# Patient Record
Sex: Female | Born: 1980 | Race: Black or African American | Hispanic: No | Marital: Married | Smoking: Never smoker
Health system: Southern US, Community
[De-identification: ages and names within clinical notes are randomized; demographics above are authoritative.]

## PROBLEM LIST (undated history)

## (undated) DIAGNOSIS — G5603 Carpal tunnel syndrome, bilateral upper limbs: Secondary | ICD-10-CM

## (undated) DIAGNOSIS — G932 Benign intracranial hypertension: Secondary | ICD-10-CM

## (undated) HISTORY — PX: ADENOIDECTOMY: SUR15

## (undated) HISTORY — PX: ABDOMINAL HYSTERECTOMY: SHX81

---

## 2000-03-01 ENCOUNTER — Other Ambulatory Visit: Admission: RE | Admit: 2000-03-01 | Discharge: 2000-03-01 | Payer: Self-pay | Admitting: Family Medicine

## 2000-10-28 ENCOUNTER — Other Ambulatory Visit: Admission: RE | Admit: 2000-10-28 | Discharge: 2000-10-28 | Payer: Self-pay | Admitting: Obstetrics and Gynecology

## 2000-11-11 ENCOUNTER — Ambulatory Visit (HOSPITAL_COMMUNITY): Admission: RE | Admit: 2000-11-11 | Discharge: 2000-11-11 | Payer: Self-pay | Admitting: Obstetrics and Gynecology

## 2000-11-11 ENCOUNTER — Encounter: Payer: Self-pay | Admitting: Obstetrics and Gynecology

## 2000-12-03 ENCOUNTER — Inpatient Hospital Stay (HOSPITAL_COMMUNITY): Admission: AD | Admit: 2000-12-03 | Discharge: 2000-12-03 | Payer: Self-pay | Admitting: Obstetrics and Gynecology

## 2001-02-11 ENCOUNTER — Encounter: Payer: Self-pay | Admitting: Obstetrics and Gynecology

## 2001-02-11 ENCOUNTER — Ambulatory Visit (HOSPITAL_COMMUNITY): Admission: RE | Admit: 2001-02-11 | Discharge: 2001-02-11 | Payer: Self-pay | Admitting: Obstetrics and Gynecology

## 2001-03-11 ENCOUNTER — Inpatient Hospital Stay (HOSPITAL_COMMUNITY): Admission: AD | Admit: 2001-03-11 | Discharge: 2001-03-14 | Payer: Self-pay | Admitting: *Deleted

## 2001-03-31 ENCOUNTER — Emergency Department (HOSPITAL_COMMUNITY): Admission: EM | Admit: 2001-03-31 | Discharge: 2001-04-01 | Payer: Self-pay | Admitting: Emergency Medicine

## 2001-04-01 ENCOUNTER — Ambulatory Visit (HOSPITAL_COMMUNITY): Admission: RE | Admit: 2001-04-01 | Discharge: 2001-04-01 | Payer: Self-pay | Admitting: Obstetrics and Gynecology

## 2001-04-05 ENCOUNTER — Encounter: Admission: RE | Admit: 2001-04-05 | Discharge: 2001-04-05 | Payer: Self-pay | Admitting: Internal Medicine

## 2001-11-29 ENCOUNTER — Other Ambulatory Visit: Admission: RE | Admit: 2001-11-29 | Discharge: 2001-11-29 | Payer: Self-pay | Admitting: Obstetrics and Gynecology

## 2002-02-07 ENCOUNTER — Emergency Department (HOSPITAL_COMMUNITY): Admission: EM | Admit: 2002-02-07 | Discharge: 2002-02-07 | Payer: Self-pay | Admitting: Physical Therapy

## 2002-12-13 ENCOUNTER — Other Ambulatory Visit: Admission: RE | Admit: 2002-12-13 | Discharge: 2002-12-13 | Payer: Self-pay | Admitting: Obstetrics and Gynecology

## 2005-03-28 ENCOUNTER — Emergency Department (HOSPITAL_COMMUNITY): Admission: EM | Admit: 2005-03-28 | Discharge: 2005-03-28 | Payer: Self-pay | Admitting: Emergency Medicine

## 2005-05-11 ENCOUNTER — Emergency Department (HOSPITAL_COMMUNITY): Admission: EM | Admit: 2005-05-11 | Discharge: 2005-05-11 | Payer: Self-pay | Admitting: Emergency Medicine

## 2005-07-31 ENCOUNTER — Ambulatory Visit (HOSPITAL_COMMUNITY): Admission: RE | Admit: 2005-07-31 | Discharge: 2005-07-31 | Payer: Self-pay | Admitting: Neurology

## 2005-09-18 ENCOUNTER — Ambulatory Visit (HOSPITAL_COMMUNITY): Admission: RE | Admit: 2005-09-18 | Discharge: 2005-09-18 | Payer: Self-pay | Admitting: Neurology

## 2006-02-25 ENCOUNTER — Emergency Department (HOSPITAL_COMMUNITY): Admission: EM | Admit: 2006-02-25 | Discharge: 2006-02-25 | Payer: Self-pay | Admitting: Family Medicine

## 2010-11-24 ENCOUNTER — Other Ambulatory Visit: Payer: Self-pay | Admitting: Family Medicine

## 2010-11-24 ENCOUNTER — Other Ambulatory Visit (HOSPITAL_COMMUNITY)
Admission: RE | Admit: 2010-11-24 | Discharge: 2010-11-24 | Disposition: A | Discharge status: Home / Self Care | Payer: Self-pay | Source: Ambulatory Visit | Attending: Family Medicine | Admitting: Family Medicine

## 2010-11-24 DIAGNOSIS — Z124 Encounter for screening for malignant neoplasm of cervix: Secondary | ICD-10-CM | POA: Insufficient documentation

## 2013-04-24 ENCOUNTER — Ambulatory Visit
Admission: RE | Admit: 2013-04-24 | Discharge: 2013-04-24 | Disposition: A | Discharge status: Home / Self Care | Payer: BC Managed Care – PPO | Source: Ambulatory Visit | Attending: Family Medicine | Admitting: Family Medicine

## 2013-04-24 ENCOUNTER — Other Ambulatory Visit: Payer: Self-pay | Admitting: Family Medicine

## 2013-04-24 DIAGNOSIS — M545 Low back pain, unspecified: Secondary | ICD-10-CM

## 2013-04-24 DIAGNOSIS — M25552 Pain in left hip: Secondary | ICD-10-CM

## 2013-04-24 DIAGNOSIS — M25551 Pain in right hip: Secondary | ICD-10-CM

## 2014-02-02 ENCOUNTER — Other Ambulatory Visit (HOSPITAL_COMMUNITY)
Admission: RE | Admit: 2014-02-02 | Discharge: 2014-02-02 | Disposition: A | Discharge status: Home / Self Care | Payer: BC Managed Care – PPO | Source: Ambulatory Visit | Attending: Family Medicine | Admitting: Family Medicine

## 2014-02-02 ENCOUNTER — Other Ambulatory Visit: Payer: Self-pay | Admitting: Family Medicine

## 2014-02-02 DIAGNOSIS — Z01419 Encounter for gynecological examination (general) (routine) without abnormal findings: Secondary | ICD-10-CM | POA: Insufficient documentation

## 2014-02-02 DIAGNOSIS — Z1151 Encounter for screening for human papillomavirus (HPV): Secondary | ICD-10-CM | POA: Insufficient documentation

## 2014-02-05 LAB — CYTOLOGY - PAP

## 2017-03-23 ENCOUNTER — Other Ambulatory Visit: Payer: Self-pay | Admitting: Family Medicine

## 2017-03-23 ENCOUNTER — Other Ambulatory Visit (HOSPITAL_COMMUNITY)
Admission: RE | Admit: 2017-03-23 | Discharge: 2017-03-23 | Disposition: A | Discharge status: Home / Self Care | Payer: 59 | Source: Ambulatory Visit | Attending: Family Medicine | Admitting: Family Medicine

## 2017-03-23 DIAGNOSIS — Z01411 Encounter for gynecological examination (general) (routine) with abnormal findings: Secondary | ICD-10-CM | POA: Insufficient documentation

## 2017-03-25 LAB — CYTOLOGY - PAP
Diagnosis: NEGATIVE
HPV (WINDOPATH): NOT DETECTED

## 2018-04-20 DIAGNOSIS — Z0000 Encounter for general adult medical examination without abnormal findings: Secondary | ICD-10-CM | POA: Diagnosis not present

## 2018-04-20 DIAGNOSIS — Z713 Dietary counseling and surveillance: Secondary | ICD-10-CM | POA: Diagnosis not present

## 2018-04-20 DIAGNOSIS — Z6841 Body Mass Index (BMI) 40.0 and over, adult: Secondary | ICD-10-CM | POA: Diagnosis not present

## 2018-04-20 DIAGNOSIS — R7303 Prediabetes: Secondary | ICD-10-CM | POA: Diagnosis not present

## 2018-04-29 DIAGNOSIS — G5603 Carpal tunnel syndrome, bilateral upper limbs: Secondary | ICD-10-CM | POA: Diagnosis not present

## 2018-12-02 DIAGNOSIS — R6882 Decreased libido: Secondary | ICD-10-CM | POA: Diagnosis not present

## 2018-12-28 DIAGNOSIS — N9419 Other specified dyspareunia: Secondary | ICD-10-CM | POA: Diagnosis not present

## 2018-12-28 DIAGNOSIS — B373 Candidiasis of vulva and vagina: Secondary | ICD-10-CM | POA: Diagnosis not present

## 2018-12-28 DIAGNOSIS — N946 Dysmenorrhea, unspecified: Secondary | ICD-10-CM | POA: Diagnosis not present

## 2018-12-29 ENCOUNTER — Other Ambulatory Visit: Payer: Self-pay

## 2018-12-29 DIAGNOSIS — Z20822 Contact with and (suspected) exposure to covid-19: Secondary | ICD-10-CM

## 2018-12-30 LAB — NOVEL CORONAVIRUS, NAA: SARS-CoV-2, NAA: NOT DETECTED

## 2019-01-03 DIAGNOSIS — N9419 Other specified dyspareunia: Secondary | ICD-10-CM | POA: Diagnosis not present

## 2019-01-03 DIAGNOSIS — G932 Benign intracranial hypertension: Secondary | ICD-10-CM | POA: Diagnosis not present

## 2019-01-03 DIAGNOSIS — N946 Dysmenorrhea, unspecified: Secondary | ICD-10-CM | POA: Diagnosis not present

## 2019-01-20 DIAGNOSIS — B373 Candidiasis of vulva and vagina: Secondary | ICD-10-CM | POA: Diagnosis not present

## 2019-01-31 DIAGNOSIS — N9419 Other specified dyspareunia: Secondary | ICD-10-CM | POA: Diagnosis not present

## 2019-01-31 DIAGNOSIS — D251 Intramural leiomyoma of uterus: Secondary | ICD-10-CM | POA: Diagnosis not present

## 2019-01-31 DIAGNOSIS — N83209 Unspecified ovarian cyst, unspecified side: Secondary | ICD-10-CM | POA: Diagnosis not present

## 2019-01-31 DIAGNOSIS — N946 Dysmenorrhea, unspecified: Secondary | ICD-10-CM | POA: Diagnosis not present

## 2019-01-31 DIAGNOSIS — Z20828 Contact with and (suspected) exposure to other viral communicable diseases: Secondary | ICD-10-CM | POA: Diagnosis not present

## 2019-01-31 DIAGNOSIS — Z01812 Encounter for preprocedural laboratory examination: Secondary | ICD-10-CM | POA: Diagnosis not present

## 2019-02-01 DIAGNOSIS — N941 Unspecified dyspareunia: Secondary | ICD-10-CM | POA: Diagnosis not present

## 2019-02-01 DIAGNOSIS — G8929 Other chronic pain: Secondary | ICD-10-CM | POA: Diagnosis not present

## 2019-02-01 DIAGNOSIS — N946 Dysmenorrhea, unspecified: Secondary | ICD-10-CM | POA: Diagnosis not present

## 2019-02-01 DIAGNOSIS — R102 Pelvic and perineal pain: Secondary | ICD-10-CM | POA: Diagnosis not present

## 2019-02-06 DIAGNOSIS — D251 Intramural leiomyoma of uterus: Secondary | ICD-10-CM | POA: Diagnosis not present

## 2019-02-06 DIAGNOSIS — N801 Endometriosis of ovary: Secondary | ICD-10-CM | POA: Diagnosis not present

## 2019-02-06 DIAGNOSIS — N944 Primary dysmenorrhea: Secondary | ICD-10-CM | POA: Diagnosis not present

## 2019-02-06 DIAGNOSIS — N946 Dysmenorrhea, unspecified: Secondary | ICD-10-CM | POA: Diagnosis not present

## 2019-02-06 DIAGNOSIS — D259 Leiomyoma of uterus, unspecified: Secondary | ICD-10-CM | POA: Diagnosis not present

## 2019-02-06 DIAGNOSIS — N736 Female pelvic peritoneal adhesions (postinfective): Secondary | ICD-10-CM | POA: Diagnosis not present

## 2019-02-06 DIAGNOSIS — N9419 Other specified dyspareunia: Secondary | ICD-10-CM | POA: Diagnosis not present

## 2019-02-07 DIAGNOSIS — N736 Female pelvic peritoneal adhesions (postinfective): Secondary | ICD-10-CM | POA: Diagnosis not present

## 2019-02-07 DIAGNOSIS — N9419 Other specified dyspareunia: Secondary | ICD-10-CM | POA: Diagnosis not present

## 2019-02-07 DIAGNOSIS — N946 Dysmenorrhea, unspecified: Secondary | ICD-10-CM | POA: Diagnosis not present

## 2019-02-07 DIAGNOSIS — N801 Endometriosis of ovary: Secondary | ICD-10-CM | POA: Diagnosis not present

## 2019-02-07 DIAGNOSIS — D251 Intramural leiomyoma of uterus: Secondary | ICD-10-CM | POA: Diagnosis not present

## 2019-03-22 DIAGNOSIS — N941 Unspecified dyspareunia: Secondary | ICD-10-CM | POA: Diagnosis not present

## 2019-03-22 DIAGNOSIS — N946 Dysmenorrhea, unspecified: Secondary | ICD-10-CM | POA: Diagnosis not present

## 2019-05-03 DIAGNOSIS — G5603 Carpal tunnel syndrome, bilateral upper limbs: Secondary | ICD-10-CM | POA: Diagnosis not present

## 2019-05-24 DIAGNOSIS — Z0000 Encounter for general adult medical examination without abnormal findings: Secondary | ICD-10-CM | POA: Diagnosis not present

## 2019-06-14 DIAGNOSIS — Z713 Dietary counseling and surveillance: Secondary | ICD-10-CM | POA: Diagnosis not present

## 2019-06-14 DIAGNOSIS — R7303 Prediabetes: Secondary | ICD-10-CM | POA: Diagnosis not present

## 2019-06-14 DIAGNOSIS — Z6841 Body Mass Index (BMI) 40.0 and over, adult: Secondary | ICD-10-CM | POA: Diagnosis not present

## 2019-06-21 ENCOUNTER — Other Ambulatory Visit: Payer: Self-pay | Admitting: Orthopedic Surgery

## 2019-06-21 DIAGNOSIS — G5603 Carpal tunnel syndrome, bilateral upper limbs: Secondary | ICD-10-CM | POA: Diagnosis not present

## 2019-06-30 ENCOUNTER — Ambulatory Visit: Payer: 59 | Attending: Internal Medicine

## 2019-06-30 DIAGNOSIS — Z20822 Contact with and (suspected) exposure to covid-19: Secondary | ICD-10-CM

## 2019-07-01 LAB — NOVEL CORONAVIRUS, NAA: SARS-CoV-2, NAA: NOT DETECTED

## 2019-07-06 ENCOUNTER — Other Ambulatory Visit: Payer: Self-pay

## 2019-07-06 ENCOUNTER — Encounter (HOSPITAL_BASED_OUTPATIENT_CLINIC_OR_DEPARTMENT_OTHER): Payer: Self-pay | Admitting: Orthopedic Surgery

## 2019-07-11 ENCOUNTER — Other Ambulatory Visit (HOSPITAL_COMMUNITY)
Admission: RE | Admit: 2019-07-11 | Discharge: 2019-07-11 | Disposition: A | Discharge status: Home / Self Care | Payer: BC Managed Care – PPO | Source: Ambulatory Visit | Attending: Orthopedic Surgery | Admitting: Orthopedic Surgery

## 2019-07-11 DIAGNOSIS — Z20822 Contact with and (suspected) exposure to covid-19: Secondary | ICD-10-CM | POA: Insufficient documentation

## 2019-07-11 DIAGNOSIS — Z01812 Encounter for preprocedural laboratory examination: Secondary | ICD-10-CM | POA: Insufficient documentation

## 2019-07-11 LAB — SARS CORONAVIRUS 2 (TAT 6-24 HRS): SARS Coronavirus 2: NEGATIVE

## 2019-07-11 NOTE — Progress Notes (Signed)

## 2019-07-14 ENCOUNTER — Encounter (HOSPITAL_BASED_OUTPATIENT_CLINIC_OR_DEPARTMENT_OTHER)
Admission: RE | Disposition: A | Discharge status: Home / Self Care | Payer: Self-pay | Source: Ambulatory Visit | Attending: Orthopedic Surgery

## 2019-07-14 ENCOUNTER — Other Ambulatory Visit: Payer: Self-pay

## 2019-07-14 ENCOUNTER — Ambulatory Visit (HOSPITAL_BASED_OUTPATIENT_CLINIC_OR_DEPARTMENT_OTHER): Payer: BC Managed Care – PPO | Admitting: Anesthesiology

## 2019-07-14 ENCOUNTER — Ambulatory Visit (HOSPITAL_BASED_OUTPATIENT_CLINIC_OR_DEPARTMENT_OTHER)
Admission: RE | Admit: 2019-07-14 | Discharge: 2019-07-14 | Disposition: A | Discharge status: Home / Self Care | Payer: BC Managed Care – PPO | Source: Ambulatory Visit | Attending: Orthopedic Surgery | Admitting: Orthopedic Surgery

## 2019-07-14 ENCOUNTER — Encounter (HOSPITAL_BASED_OUTPATIENT_CLINIC_OR_DEPARTMENT_OTHER): Payer: Self-pay | Admitting: Orthopedic Surgery

## 2019-07-14 DIAGNOSIS — G932 Benign intracranial hypertension: Secondary | ICD-10-CM | POA: Insufficient documentation

## 2019-07-14 DIAGNOSIS — G5601 Carpal tunnel syndrome, right upper limb: Secondary | ICD-10-CM | POA: Diagnosis not present

## 2019-07-14 DIAGNOSIS — G5603 Carpal tunnel syndrome, bilateral upper limbs: Secondary | ICD-10-CM | POA: Insufficient documentation

## 2019-07-14 DIAGNOSIS — Z6841 Body Mass Index (BMI) 40.0 and over, adult: Secondary | ICD-10-CM | POA: Diagnosis not present

## 2019-07-14 HISTORY — DX: Benign intracranial hypertension: G93.2

## 2019-07-14 HISTORY — PX: CARPAL TUNNEL RELEASE: SHX101

## 2019-07-14 HISTORY — DX: Carpal tunnel syndrome, bilateral upper limbs: G56.03

## 2019-07-14 SURGERY — CARPAL TUNNEL RELEASE
Anesthesia: Monitor Anesthesia Care | Site: Wrist | Laterality: Right

## 2019-07-14 MED ORDER — CEFAZOLIN SODIUM-DEXTROSE 2-4 GM/100ML-% IV SOLN
INTRAVENOUS | Status: AC
  Filled 2019-07-14: qty 100

## 2019-07-14 MED ORDER — 0.9 % SODIUM CHLORIDE (POUR BTL) OPTIME
TOPICAL | Status: DC | PRN
  Administered 2019-07-14: 1000 mL

## 2019-07-14 MED ORDER — ONDANSETRON HCL 4 MG/2ML IJ SOLN
INTRAMUSCULAR | Status: DC | PRN
  Administered 2019-07-14: 4 mg via INTRAVENOUS

## 2019-07-14 MED ORDER — BUPIVACAINE HCL (PF) 0.25 % IJ SOLN
INTRAMUSCULAR | Status: AC
  Filled 2019-07-14: qty 30

## 2019-07-14 MED ORDER — PROPOFOL 500 MG/50ML IV EMUL
INTRAVENOUS | Status: AC
  Filled 2019-07-14: qty 100

## 2019-07-14 MED ORDER — FENTANYL CITRATE (PF) 100 MCG/2ML IJ SOLN: 25.0000 ug | INTRAMUSCULAR | Status: DC | PRN

## 2019-07-14 MED ORDER — FENTANYL CITRATE (PF) 100 MCG/2ML IJ SOLN
INTRAMUSCULAR | Status: AC
  Filled 2019-07-14: qty 2

## 2019-07-14 MED ORDER — FENTANYL CITRATE (PF) 100 MCG/2ML IJ SOLN
INTRAMUSCULAR | Status: DC | PRN
  Administered 2019-07-14 (×2): 50 ug via INTRAVENOUS

## 2019-07-14 MED ORDER — ONDANSETRON HCL 4 MG/2ML IJ SOLN
INTRAMUSCULAR | Status: AC
  Filled 2019-07-14: qty 6

## 2019-07-14 MED ORDER — MIDAZOLAM HCL 5 MG/5ML IJ SOLN
INTRAMUSCULAR | Status: DC | PRN
  Administered 2019-07-14: 2 mg via INTRAVENOUS

## 2019-07-14 MED ORDER — LIDOCAINE 2% (20 MG/ML) 5 ML SYRINGE
INTRAMUSCULAR | Status: AC
  Filled 2019-07-14: qty 10

## 2019-07-14 MED ORDER — BUPIVACAINE HCL (PF) 0.25 % IJ SOLN
INTRAMUSCULAR | Status: DC | PRN
  Administered 2019-07-14: 10 mL

## 2019-07-14 MED ORDER — MIDAZOLAM HCL 2 MG/2ML IJ SOLN
INTRAMUSCULAR | Status: AC
  Filled 2019-07-14: qty 2

## 2019-07-14 MED ORDER — DEXAMETHASONE SODIUM PHOSPHATE 10 MG/ML IJ SOLN
INTRAMUSCULAR | Status: AC
  Filled 2019-07-14: qty 1

## 2019-07-14 MED ORDER — PROPOFOL 10 MG/ML IV BOLUS
INTRAVENOUS | Status: DC | PRN
  Administered 2019-07-14 (×2): 30 mg via INTRAVENOUS

## 2019-07-14 MED ORDER — HYDROCODONE-ACETAMINOPHEN 5-325 MG PO TABS: ORAL_TABLET | ORAL | 0 refills | Status: DC

## 2019-07-14 MED ORDER — ACETAMINOPHEN 500 MG PO TABS: 1000.0000 mg | ORAL_TABLET | Freq: Once | ORAL | Status: DC

## 2019-07-14 SURGICAL SUPPLY — 33 items
BLADE SURG 15 STRL LF DISP TIS (BLADE) ×2 IMPLANT
BLADE SURG 15 STRL SS (BLADE) ×2
BNDG ELASTIC 3X5.8 VLCR STR LF (GAUZE/BANDAGES/DRESSINGS) ×2 IMPLANT
BNDG ESMARK 4X9 LF (GAUZE/BANDAGES/DRESSINGS) IMPLANT
BNDG GAUZE ELAST 4 BULKY (GAUZE/BANDAGES/DRESSINGS) ×2 IMPLANT
CHLORAPREP W/TINT 26 (MISCELLANEOUS) ×2 IMPLANT
CORD BIPOLAR FORCEPS 12FT (ELECTRODE) ×2 IMPLANT
COVER BACK TABLE 60X90IN (DRAPES) ×2 IMPLANT
COVER MAYO STAND STRL (DRAPES) ×2 IMPLANT
COVER WAND RF STERILE (DRAPES) IMPLANT
CUFF TOURN SGL QUICK 18X4 (TOURNIQUET CUFF) ×2 IMPLANT
DRAPE EXTREMITY T 121X128X90 (DISPOSABLE) ×2 IMPLANT
DRAPE SURG 17X23 STRL (DRAPES) ×2 IMPLANT
DRSG PAD ABDOMINAL 8X10 ST (GAUZE/BANDAGES/DRESSINGS) ×2 IMPLANT
GAUZE SPONGE 4X4 12PLY STRL (GAUZE/BANDAGES/DRESSINGS) ×2 IMPLANT
GAUZE XEROFORM 1X8 LF (GAUZE/BANDAGES/DRESSINGS) ×2 IMPLANT
GLOVE BIO SURGEON STRL SZ7.5 (GLOVE) ×2 IMPLANT
GLOVE BIOGEL PI IND STRL 8 (GLOVE) ×1 IMPLANT
GLOVE BIOGEL PI INDICATOR 8 (GLOVE) ×1
GOWN STRL REUS W/ TWL LRG LVL3 (GOWN DISPOSABLE) ×1 IMPLANT
GOWN STRL REUS W/TWL LRG LVL3 (GOWN DISPOSABLE) ×1
GOWN STRL REUS W/TWL XL LVL3 (GOWN DISPOSABLE) ×2 IMPLANT
NEEDLE HYPO 25X1 1.5 SAFETY (NEEDLE) ×2 IMPLANT
NS IRRIG 1000ML POUR BTL (IV SOLUTION) ×2 IMPLANT
PACK BASIN DAY SURGERY FS (CUSTOM PROCEDURE TRAY) ×2 IMPLANT
PADDING CAST ABS 4INX4YD NS (CAST SUPPLIES) ×1
PADDING CAST ABS COTTON 4X4 ST (CAST SUPPLIES) ×1 IMPLANT
STOCKINETTE 4X48 STRL (DRAPES) ×2 IMPLANT
SUT ETHILON 4 0 PS 2 18 (SUTURE) ×2 IMPLANT
SYR BULB 3OZ (MISCELLANEOUS) ×2 IMPLANT
SYR CONTROL 10ML LL (SYRINGE) ×2 IMPLANT
TOWEL GREEN STERILE FF (TOWEL DISPOSABLE) ×4 IMPLANT
UNDERPAD 30X36 HEAVY ABSORB (UNDERPADS AND DIAPERS) ×2 IMPLANT

## 2019-07-14 NOTE — Anesthesia Postprocedure Evaluation (Signed)
Anesthesia Post Note  Patient: Diane Sweeney  Procedure(s) Performed: RIGHT CARPAL TUNNEL RELEASE (Right Wrist)     Patient location during evaluation: PACU Anesthesia Type: MAC Level of consciousness: awake and alert Pain management: pain level controlled Vital Signs Assessment: post-procedure vital signs reviewed and stable Respiratory status: spontaneous breathing, nonlabored ventilation, respiratory function stable and patient connected to nasal cannula oxygen Cardiovascular status: stable and blood pressure returned to baseline Postop Assessment: no apparent nausea or vomiting Anesthetic complications: no    Last Vitals:  Vitals:   07/14/19 1500 07/14/19 1515  BP: 121/82 125/86  Pulse: (!) 47 (!) 51  Resp: 14 16  Temp:    SpO2: 100% 99%    Last Pain:  Vitals:   07/14/19 1515  TempSrc:   PainSc: 0-No pain                 Jauan Wohl DAVID

## 2019-07-14 NOTE — Op Note (Signed)
07/14/2019 [facility]                              OPERATIVE REPORT   PREOPERATIVE DIAGNOSIS:  Right carpal tunnel syndrome.  POSTOPERATIVE DIAGNOSIS:  Right carpal tunnel syndrome.  PROCEDURE:  Right carpal tunnel release.  SURGEON:  Betha Loa, MD  ASSISTANT:  none.  ANESTHESIA: Bier block with sedation  IV FLUIDS:  Per anesthesia flow sheet.  ESTIMATED BLOOD LOSS:  Minimal.  COMPLICATIONS:  None.  SPECIMENS:  None.  TOURNIQUET TIME:    Total Tourniquet Time Documented: Forearm (Right) - 30 minutes Total: Forearm (Right) - 30 minutes   DISPOSITION:  Stable to PACU.  LOCATION: [facility]  INDICATIONS:  39 yo female with numbness and tingling right hand.  Positive nerve conduction studies.  She wishes to have a carpal tunnel release for management of her symptoms.  Risks, benefits and alternatives of surgery were discussed including the risk of blood loss; infection; damage to nerves, vessels, tendons, ligaments, bone; failure of surgery; need for additional surgery; complications with wound healing; continued pain; recurrence of carpal tunnel syndrome; and damage to motor branch. She voiced understanding of these risks and elected to proceed.   OPERATIVE COURSE:  After being identified preoperatively by myself, the patient and I agreed upon the procedure and site of procedure.  The surgical site was marked.  The risks, benefits, and alternatives of the surgery were reviewed and she wished to proceed.  Surgical consent had been signed.  She was given IV Ancef as preoperative antibiotic prophylaxis.  She was transferred to the operating room and placed on the operating room table in supine position with the Right upper extremity on an armboard.  Bier block anesthesia was induced by the anesthesiologist.  Right upper extremity was prepped and draped in normal sterile orthopaedic fashion.  A surgical pause was performed between the surgeons,  anesthesia, and operating room staff, and all were in agreement as to the patient, procedure, and site of procedure.  Tourniquet at the proximal aspect of the forearm had been inflated for the Bier block  Incision was made over the transverse carpal ligament and carried into the subcutaneous tissues by spreading technique.  Bipolar electrocautery was used to obtain hemostasis.  The palmar fascia was sharply incised.  There was a cutaneous branch of nerve crossing the incision proximally which was protected throughout the case.  The transverse carpal ligament was identified and sharply incised.  It was incised distally first.  Care was taken to ensure complete decompression distally.  It was then incised proximally.  Scissors were used to split the distal aspect of the volar antebrachial fascia. The nerve was visualized and was decompressed.  The nerve was examined.  It was flattened and hyperemic. and It was adherent to the radial leaflet.  The motor branch was identified and was intact.  The wound was copiously irrigated with sterile saline.  It was then closed with 4-0 nylon in a horizontal mattress fashion.  It was injected with 0.25% plain Marcaine to aid in postoperative analgesia.  It was dressed with sterile Xeroform, 4x4s, an ABD, and wrapped with Kerlix and an Ace bandage.  Tourniquet was deflated at 30 minutes.  Fingertips were pink with brisk capillary refill after deflation of the tourniquet.  Operative drapes were broken down.  The patient was awoken from anesthesia safely.  She was transferred back to stretcher and taken  to the PACU in stable condition.  I will see her back in the office in 1 week for postoperative followup.  I will give her a prescription for Norco 5/325 1-2 tabs PO q6 hours prn pain, dispense # 20.    Leanora Cover, MD Electronically signed, 07/14/19

## 2019-07-14 NOTE — H&P (Signed)
[place] Diane Sweeney is an 39 y.o. female.   Chief Complaint: right carpal tunnel syndrome HPI: 39 yo female with numbness and tingling bilateral hands.  Nocturnal symptoms.  Positive nerve conduction studies.  Allergies: No Known Allergies  Past Medical History:  Diagnosis Date  . Bilateral carpal tunnel syndrome   . Pseudotumor cerebri     Past Surgical History:  Procedure Laterality Date  . ABDOMINAL HYSTERECTOMY    . ADENOIDECTOMY      Family History: History reviewed. No pertinent family history.  Social History:   reports that she has never smoked. She has never used smokeless tobacco. She reports current alcohol use. She reports that she does not use drugs.  Medications: Medications Prior to Admission  Medication Sig Dispense Refill  . acetaminophen (TYLENOL) 325 MG tablet Take 650 mg by mouth every 6 (six) hours as needed.    . Ibuprofen 200 MG CAPS Take 200 mg by mouth every 6 (six) hours as needed.      No results found for this or any previous visit (from the past 48 hour(s)).  No results found.   A comprehensive review of systems was negative.  Blood pressure (!) 113/59, pulse (!) 55, temperature 97.7 F (36.5 C), temperature source Temporal, resp. rate 18, height 5\' 2"  (1.575 m), weight 113.3 kg, SpO2 100 %.  General appearance: alert, cooperative and appears stated age Head: Normocephalic, without obvious abnormality, atraumatic Neck: supple, symmetrical, trachea midline Cardio: regular rate and rhythm Resp: clear to auscultation bilaterally Extremities: Intact sensation and capillary refill all digits.  +epl/fpl/io.  No wounds.  Pulses: 2+ and symmetric Skin: Skin color, texture, turgor normal. No rashes or lesions Neurologic: Grossly normal Incision/Wound: none  Assessment/Plan Right carpal tunnel syndrome.  Non operative and operative treatment options have been discussed with the patient and patient wishes to proceed with operative treatment. Risks,  benefits, and alternatives of surgery have been discussed and the patient agrees with the plan of care.   07/14/2019, 1:40 PM

## 2019-07-14 NOTE — Anesthesia Preprocedure Evaluation (Addendum)
Anesthesia Evaluation  Patient identified by MRN, date of birth, ID band Patient awake    Reviewed: Allergy & Precautions, NPO status , Patient's Chart, lab work & pertinent test results  Airway Mallampati: I  TM Distance: >3 FB Neck ROM: Full    Dental no notable dental hx. (+) Teeth Intact, Dental Advisory Given   Pulmonary neg pulmonary ROS,    Pulmonary exam normal breath sounds clear to auscultation       Cardiovascular negative cardio ROS Normal cardiovascular exam Rhythm:Regular Rate:Normal     Neuro/Psych Pseudotumor cerebri negative neurological ROS  negative psych ROS   GI/Hepatic negative GI ROS, Neg liver ROS,   Endo/Other  Morbid obesity  Renal/GU negative Renal ROS  negative genitourinary   Musculoskeletal negative musculoskeletal ROS (+)   Abdominal   Peds  Hematology negative hematology ROS (+)   Anesthesia Other Findings   Reproductive/Obstetrics                            Anesthesia Physical Anesthesia Plan  ASA: III  Anesthesia Plan: MAC and Bier Block and Bier Block-LIDOCAINE ONLY   Post-op Pain Management:    Induction: Intravenous  PONV Risk Score and Plan: 2 and Propofol infusion, Treatment may vary due to age or medical condition, Midazolam and Ondansetron  Airway Management Planned: Natural Airway  Additional Equipment:   Intra-op Plan:   Post-operative Plan:   Informed Consent: I have reviewed the patients History and Physical, chart, labs and discussed the procedure including the risks, benefits and alternatives for the proposed anesthesia with the patient or authorized representative who has indicated his/her understanding and acceptance.     Dental advisory given  Plan Discussed with: CRNA  Anesthesia Plan Comments:         Anesthesia Quick Evaluation

## 2019-07-14 NOTE — Transfer of Care (Signed)
Immediate Anesthesia Transfer of Care Note  Patient: Diane Sweeney  Procedure(s) Performed: RIGHT CARPAL TUNNEL RELEASE (Right Wrist)  Patient Location: PACU  Anesthesia Type:MAC and Bier block  Level of Consciousness: awake, alert  and oriented  Airway & Oxygen Therapy: Patient Spontanous Breathing and Patient connected to face mask oxygen  Post-op Assessment: Report given to RN and Post -op Vital signs reviewed and stable  Post vital signs: Reviewed and stable  Last Vitals:  Vitals Value Taken Time  BP    Temp    Pulse    Resp 23 07/14/19 1448  SpO2    Vitals shown include unvalidated device data.  Last Pain:  Vitals:   07/14/19 1204  TempSrc: Temporal  PainSc: 0-No pain      Patients Stated Pain Goal: 4 (24/46/28 6381)  Complications: No apparent anesthesia complications

## 2019-07-14 NOTE — Discharge Instructions (Addendum)
[facility] Instructions Hand Surgery  Wound Care: Keep your hand elevated above the level of your heart.  Do not allow it to dangle by your side.  Keep the dressing dry and do not remove it unless your doctor advises you to do so.  He will usually change it at the time of your post-op visit.  Moving your fingers is advised to stimulate circulation but will depend on the site of your surgery.  If you have a splint applied, your doctor will advise you regarding movement.  Activity: Do not drive or operate machinery today.  Rest today and then you may return to your normal activity and work as indicated by your physician.  Diet:  Drink liquids today or eat a light diet.  You may resume a regular diet tomorrow.     Post Anesthesia Home Care Instructions  Activity: Get plenty of rest for the remainder of the day. A responsible individual must stay with you for 24 hours following the procedure.  For the next 24 hours, DO NOT: -Drive a car -Operate machinery -Drink alcoholic beverages -Take any medication unless instructed by your physician -Make any legal decisions or sign important papers.  Meals: Start with liquid foods such as gelatin or soup. Progress to regular foods as tolerated. Avoid greasy, spicy, heavy foods. If nausea and/or vomiting occur, drink only clear liquids until the nausea and/or vomiting subsides. Call your physician if vomiting continues.  Special Instructions/Symptoms: Your throat may feel dry or sore from the anesthesia or the breathing tube placed in your throat during surgery. If this causes discomfort, gargle with warm salt water. The discomfort should disappear within 24 hours.  If you had a scopolamine patch placed behind your ear for the management of post- operative nausea and/or vomiting:  1. The medication in the patch is effective for 72 hours, after which it should be removed.  Wrap patch in a tissue and discard in the trash. Wash hands thoroughly with  soap and water. 2. You may remove the patch earlier than 72 hours if you experience unpleasant side effects which may include dry mouth, dizziness or visual disturbances. 3. Avoid touching the patch. Wash your hands with soap and water after contact with the patch.    Regional Anesthesia Blocks  1. Numbness or the inability to move the "blocked" extremity may last from 3-48 hours after placement. The length of time depends on the medication injected and your individual response to the medication. If the numbness is not going away after 48 hours, call your surgeon.  2. The extremity that is blocked will need to be protected until the numbness is gone and the  Strength has returned. Because you cannot feel it, you will need to take extra care to avoid injury. Because it may be weak, you may have difficulty moving it or using it. You may not know what position it is in without looking at it while the block is in effect.  3. For blocks in the legs and feet, returning to weight bearing and walking needs to be done carefully. You will need to wait until the numbness is entirely gone and the strength has returned. You should be able to move your leg and foot normally before you try and bear weight or walk. You will need someone to be with you when you first try to ensure you do not fall and possibly risk injury.  4. Bruising and tenderness at the needle site are common side effects and   will resolve in a few days.  5. Persistent numbness or new problems with movement should be communicated to the surgeon or the [facility] ([phone])/ [facility] ([phone]).  General expectations: Pain for two to three days. Fingers may become slightly swollen.  Call your doctor if any of the following occur: Severe pain not relieved by pain medication. Elevated temperature. Dressing soaked with blood. Inability to move fingers. White or bluish color to fingers.  

## 2019-07-17 ENCOUNTER — Encounter: Payer: Self-pay | Admitting: *Deleted

## 2019-08-16 DIAGNOSIS — Z03818 Encounter for observation for suspected exposure to other biological agents ruled out: Secondary | ICD-10-CM | POA: Diagnosis not present

## 2019-08-16 DIAGNOSIS — Z20828 Contact with and (suspected) exposure to other viral communicable diseases: Secondary | ICD-10-CM | POA: Diagnosis not present

## 2019-08-31 DIAGNOSIS — U071 COVID-19: Secondary | ICD-10-CM | POA: Diagnosis not present

## 2019-08-31 DIAGNOSIS — Z20828 Contact with and (suspected) exposure to other viral communicable diseases: Secondary | ICD-10-CM | POA: Diagnosis not present

## 2019-09-11 DIAGNOSIS — Z03818 Encounter for observation for suspected exposure to other biological agents ruled out: Secondary | ICD-10-CM | POA: Diagnosis not present

## 2019-09-11 DIAGNOSIS — Z20828 Contact with and (suspected) exposure to other viral communicable diseases: Secondary | ICD-10-CM | POA: Diagnosis not present

## 2019-12-01 ENCOUNTER — Other Ambulatory Visit: Payer: Self-pay | Admitting: Orthopedic Surgery

## 2020-01-05 ENCOUNTER — Encounter (HOSPITAL_BASED_OUTPATIENT_CLINIC_OR_DEPARTMENT_OTHER): Payer: Self-pay | Admitting: Orthopedic Surgery

## 2020-01-05 ENCOUNTER — Other Ambulatory Visit: Payer: Self-pay

## 2020-01-09 ENCOUNTER — Other Ambulatory Visit (HOSPITAL_COMMUNITY)
Admission: RE | Admit: 2020-01-09 | Discharge: 2020-01-09 | Disposition: A | Discharge status: Home / Self Care | Payer: BC Managed Care – PPO | Source: Ambulatory Visit | Attending: Orthopedic Surgery | Admitting: Orthopedic Surgery

## 2020-01-09 DIAGNOSIS — Z20822 Contact with and (suspected) exposure to covid-19: Secondary | ICD-10-CM | POA: Insufficient documentation

## 2020-01-09 DIAGNOSIS — Z01812 Encounter for preprocedural laboratory examination: Secondary | ICD-10-CM | POA: Diagnosis not present

## 2020-01-09 LAB — SARS CORONAVIRUS 2 (TAT 6-24 HRS): SARS Coronavirus 2: NEGATIVE

## 2020-01-11 NOTE — Anesthesia Preprocedure Evaluation (Addendum)
Anesthesia Evaluation  Patient identified by MRN, date of birth, ID band Patient awake    Reviewed: Allergy & Precautions, H&P , NPO status , Patient's Chart, lab work & pertinent test results  Airway Mallampati: II  TM Distance: >3 FB Neck ROM: Full    Dental no notable dental hx. (+) Teeth Intact, Dental Advisory Given   Pulmonary neg pulmonary ROS,    Pulmonary exam normal breath sounds clear to auscultation       Cardiovascular Exercise Tolerance: Good negative cardio ROS   Rhythm:Regular Rate:Normal     Neuro/Psych Psedotumor cerebri negative neurological ROS  negative psych ROS   GI/Hepatic negative GI ROS, Neg liver ROS,   Endo/Other  Morbid obesity  Renal/GU negative Renal ROS  negative genitourinary   Musculoskeletal negative musculoskeletal ROS (+)   Abdominal   Peds  Hematology negative hematology ROS (+)   Anesthesia Other Findings   Reproductive/Obstetrics negative OB ROS                           Anesthesia Physical Anesthesia Plan  ASA: III  Anesthesia Plan: Bier Block and MAC and Bier Block-LIDOCAINE ONLY   Post-op Pain Management:    Induction: Intravenous  PONV Risk Score and Plan: 2 and Treatment may vary due to age or medical condition, Midazolam, Ondansetron and Propofol infusion  Airway Management Planned: Natural Airway  Additional Equipment: None  Intra-op Plan:   Post-operative Plan:   Informed Consent: I have reviewed the patients History and Physical, chart, labs and discussed the procedure including the risks, benefits and alternatives for the proposed anesthesia with the patient or authorized representative who has indicated his/her understanding and acceptance.     Dental advisory given  Plan Discussed with: CRNA and Anesthesiologist  Anesthesia Plan Comments: (L Carpal tunnel)       Anesthesia Quick Evaluation

## 2020-01-12 ENCOUNTER — Ambulatory Visit (HOSPITAL_BASED_OUTPATIENT_CLINIC_OR_DEPARTMENT_OTHER)
Admission: RE | Admit: 2020-01-12 | Discharge: 2020-01-12 | Disposition: A | Discharge status: Home / Self Care | Payer: BC Managed Care – PPO | Attending: Orthopedic Surgery | Admitting: Orthopedic Surgery

## 2020-01-12 ENCOUNTER — Encounter (HOSPITAL_BASED_OUTPATIENT_CLINIC_OR_DEPARTMENT_OTHER)
Admission: RE | Disposition: A | Discharge status: Home / Self Care | Payer: Self-pay | Source: Home / Self Care | Attending: Orthopedic Surgery

## 2020-01-12 ENCOUNTER — Ambulatory Visit (HOSPITAL_BASED_OUTPATIENT_CLINIC_OR_DEPARTMENT_OTHER): Payer: BC Managed Care – PPO | Admitting: Anesthesiology

## 2020-01-12 ENCOUNTER — Other Ambulatory Visit: Payer: Self-pay

## 2020-01-12 ENCOUNTER — Encounter (HOSPITAL_BASED_OUTPATIENT_CLINIC_OR_DEPARTMENT_OTHER): Payer: Self-pay | Admitting: Orthopedic Surgery

## 2020-01-12 DIAGNOSIS — Z6841 Body Mass Index (BMI) 40.0 and over, adult: Secondary | ICD-10-CM | POA: Insufficient documentation

## 2020-01-12 DIAGNOSIS — G5602 Carpal tunnel syndrome, left upper limb: Secondary | ICD-10-CM | POA: Insufficient documentation

## 2020-01-12 HISTORY — PX: CARPAL TUNNEL RELEASE: SHX101

## 2020-01-12 SURGERY — CARPAL TUNNEL RELEASE
Anesthesia: Monitor Anesthesia Care | Site: Wrist | Laterality: Left

## 2020-01-12 MED ORDER — LIDOCAINE HCL (CARDIAC) PF 100 MG/5ML IV SOSY
PREFILLED_SYRINGE | INTRAVENOUS | Status: DC | PRN
  Administered 2020-01-12: 20 mg via INTRAVENOUS

## 2020-01-12 MED ORDER — LACTATED RINGERS IV SOLN: INTRAVENOUS | Status: DC

## 2020-01-12 MED ORDER — ACETAMINOPHEN 500 MG PO TABS
1000.0000 mg | ORAL_TABLET | Freq: Once | ORAL | Status: AC
  Administered 2020-01-12: 1000 mg via ORAL

## 2020-01-12 MED ORDER — BUPIVACAINE HCL (PF) 0.25 % IJ SOLN
INTRAMUSCULAR | Status: DC | PRN
  Administered 2020-01-12: 10 mL

## 2020-01-12 MED ORDER — HYDROCODONE-ACETAMINOPHEN 5-325 MG PO TABS
ORAL_TABLET | ORAL | 0 refills | Status: AC
Start: 2020-01-12 — End: ?

## 2020-01-12 MED ORDER — FENTANYL CITRATE (PF) 100 MCG/2ML IJ SOLN
INTRAMUSCULAR | Status: DC | PRN
Start: 2020-01-12 — End: 2020-01-12
  Administered 2020-01-12: 50 ug via INTRAVENOUS
  Administered 2020-01-12 (×2): 25 ug via INTRAVENOUS

## 2020-01-12 MED ORDER — CEFAZOLIN SODIUM-DEXTROSE 2-4 GM/100ML-% IV SOLN
INTRAVENOUS | Status: AC
  Filled 2020-01-12: qty 100

## 2020-01-12 MED ORDER — LIDOCAINE 2% (20 MG/ML) 5 ML SYRINGE
INTRAMUSCULAR | Status: AC
  Filled 2020-01-12: qty 5

## 2020-01-12 MED ORDER — MIDAZOLAM HCL 5 MG/5ML IJ SOLN
INTRAMUSCULAR | Status: DC | PRN
  Administered 2020-01-12: 2 mg via INTRAVENOUS

## 2020-01-12 MED ORDER — PROPOFOL 500 MG/50ML IV EMUL
INTRAVENOUS | Status: DC | PRN
  Administered 2020-01-12: 50 ug/kg/min via INTRAVENOUS

## 2020-01-12 MED ORDER — CEFAZOLIN SODIUM-DEXTROSE 2-4 GM/100ML-% IV SOLN
2.0000 g | INTRAVENOUS | Status: AC
  Administered 2020-01-12: 2 g via INTRAVENOUS

## 2020-01-12 MED ORDER — ACETAMINOPHEN 500 MG PO TABS
ORAL_TABLET | ORAL | Status: AC
  Filled 2020-01-12: qty 2

## 2020-01-12 MED ORDER — ONDANSETRON HCL 4 MG/2ML IJ SOLN
INTRAMUSCULAR | Status: DC | PRN
  Administered 2020-01-12: 4 mg via INTRAVENOUS

## 2020-01-12 MED ORDER — MIDAZOLAM HCL 2 MG/2ML IJ SOLN
INTRAMUSCULAR | Status: AC
  Filled 2020-01-12: qty 2

## 2020-01-12 MED ORDER — FENTANYL CITRATE (PF) 100 MCG/2ML IJ SOLN
INTRAMUSCULAR | Status: AC
  Filled 2020-01-12: qty 2

## 2020-01-12 MED ORDER — HYDROMORPHONE HCL 1 MG/ML IJ SOLN: 0.2500 mg | INTRAMUSCULAR | Status: DC | PRN

## 2020-01-12 MED ORDER — LIDOCAINE HCL (PF) 0.5 % IJ SOLN
INTRAMUSCULAR | Status: DC | PRN
  Administered 2020-01-12: 30 mL via INTRAVENOUS

## 2020-01-12 SURGICAL SUPPLY — 35 items
APL PRP STRL LF DISP 70% ISPRP (MISCELLANEOUS) ×1
BLADE SURG 15 STRL LF DISP TIS (BLADE) ×2 IMPLANT
BLADE SURG 15 STRL SS (BLADE) ×4
BNDG CMPR 9X4 STRL LF SNTH (GAUZE/BANDAGES/DRESSINGS) ×1
BNDG ELASTIC 3X5.8 VLCR STR LF (GAUZE/BANDAGES/DRESSINGS) ×2 IMPLANT
BNDG ESMARK 4X9 LF (GAUZE/BANDAGES/DRESSINGS) ×2 IMPLANT
BNDG GAUZE ELAST 4 BULKY (GAUZE/BANDAGES/DRESSINGS) ×2 IMPLANT
CHLORAPREP W/TINT 26 (MISCELLANEOUS) ×2 IMPLANT
CORD BIPOLAR FORCEPS 12FT (ELECTRODE) ×2 IMPLANT
COVER BACK TABLE 60X90IN (DRAPES) ×2 IMPLANT
COVER MAYO STAND STRL (DRAPES) ×2 IMPLANT
COVER WAND RF STERILE (DRAPES) IMPLANT
CUFF TOURN SGL QUICK 18X4 (TOURNIQUET CUFF) ×2 IMPLANT
DRAPE EXTREMITY T 121X128X90 (DISPOSABLE) ×2 IMPLANT
DRAPE SURG 17X23 STRL (DRAPES) ×2 IMPLANT
DRSG PAD ABDOMINAL 8X10 ST (GAUZE/BANDAGES/DRESSINGS) ×2 IMPLANT
GAUZE SPONGE 4X4 12PLY STRL (GAUZE/BANDAGES/DRESSINGS) ×2 IMPLANT
GAUZE XEROFORM 1X8 LF (GAUZE/BANDAGES/DRESSINGS) ×2 IMPLANT
GLOVE BIO SURGEON STRL SZ7.5 (GLOVE) ×4 IMPLANT
GLOVE BIOGEL PI IND STRL 8 (GLOVE) ×2 IMPLANT
GLOVE BIOGEL PI INDICATOR 8 (GLOVE) ×2
GOWN STRL REUS W/ TWL LRG LVL3 (GOWN DISPOSABLE) ×1 IMPLANT
GOWN STRL REUS W/TWL LRG LVL3 (GOWN DISPOSABLE) ×2
GOWN STRL REUS W/TWL XL LVL3 (GOWN DISPOSABLE) ×2 IMPLANT
NEEDLE HYPO 25X1 1.5 SAFETY (NEEDLE) ×2 IMPLANT
NS IRRIG 1000ML POUR BTL (IV SOLUTION) ×2 IMPLANT
PACK BASIN DAY SURGERY FS (CUSTOM PROCEDURE TRAY) ×2 IMPLANT
PADDING CAST ABS 4INX4YD NS (CAST SUPPLIES) ×1
PADDING CAST ABS COTTON 4X4 ST (CAST SUPPLIES) ×1 IMPLANT
STOCKINETTE 4X48 STRL (DRAPES) ×2 IMPLANT
SUT ETHILON 4 0 PS 2 18 (SUTURE) ×2 IMPLANT
SYR BULB EAR ULCER 3OZ GRN STR (SYRINGE) ×2 IMPLANT
SYR CONTROL 10ML LL (SYRINGE) ×2 IMPLANT
TOWEL GREEN STERILE FF (TOWEL DISPOSABLE) ×4 IMPLANT
UNDERPAD 30X36 HEAVY ABSORB (UNDERPADS AND DIAPERS) ×2 IMPLANT

## 2020-01-12 NOTE — Anesthesia Procedure Notes (Signed)
Anesthesia Regional Block: Bier block (IV Regional)   Pre-Anesthetic Checklist: ,, timeout performed, Correct Patient, Correct Site, Correct Laterality, Correct Procedure, Correct Position, site marked, Risks and benefits discussed, Surgical consent,  Pre-op evaluation,  At surgeon's request  Laterality: Left      Procedures:,,,,,, Esmarch exsanguination, single tourniquet utilized,  Narrative:  Start time: 01/12/2020 2:12 PM End time: 01/12/2020 2:13 PM Injection made incrementally with aspirations every 30 mL.  Performed by: Personally  CRNA: Thornell Mule, CRNA

## 2020-01-12 NOTE — Op Note (Signed)
01/12/2020 [facility]                              OPERATIVE REPORT   PREOPERATIVE DIAGNOSIS:  Left carpal tunnel syndrome.  POSTOPERATIVE DIAGNOSIS:  Left carpal tunnel syndrome.  PROCEDURE:  Left carpal tunnel release.  SURGEON:  Betha Loa, MD  ASSISTANT:  none.  ANESTHESIA: Bier block with sedation  IV FLUIDS:  Per anesthesia flow sheet.  ESTIMATED BLOOD LOSS:  Minimal.  COMPLICATIONS:  None.  SPECIMENS:  None.  TOURNIQUET TIME:    Total Tourniquet Time Documented: Forearm (Left) - 32 minutes Total: Forearm (Left) - 32 minutes   DISPOSITION:  Stable to PACU.  LOCATION: [facility]  INDICATIONS:  39 yo female with numbness and tingling left hand.  Positive nerve conduction studies.  Nocturnal symptoms.  She wishes to have a carpal tunnel release for management of her symptoms.  Risks, benefits and alternatives of surgery were discussed including the risk of blood loss; infection; damage to nerves, vessels, tendons, ligaments, bone; failure of surgery; need for additional surgery; complications with wound healing; continued pain; recurrence of carpal tunnel syndrome; and damage to motor branch. She voiced understanding of these risks and elected to proceed.   OPERATIVE COURSE:  After being identified preoperatively by myself, the patient and I agreed upon the procedure and site of procedure.  The surgical site was marked.  The risks, benefits, and alternatives of the surgery were reviewed and she wished to proceed.  Surgical consent had been signed.  She was given IV Ancef as preoperative antibiotic prophylaxis.  She was transferred to the operating room and placed on the operating room table in supine position with the Left upper extremity on an armboard.  Bier block anesthesia was induced by the anesthesiologist.  Left upper extremity was prepped and draped in normal sterile orthopaedic fashion.  A surgical pause was performed between the  surgeons, anesthesia, and operating room staff, and all were in agreement as to the patient, procedure, and site of procedure.  Tourniquet at the proximal aspect of the forearm had been inflated for the Bier block  Incision was made over the transverse carpal ligament and carried into the subcutaneous tissues by spreading technique.  Bipolar electrocautery was used to obtain hemostasis.  The palmar fascia was sharply incised.  The transverse carpal ligament was identified and sharply incised.  It was incised distally first.  The flexor tendons were identified.  The flexor tendon to the ring finger was identified and retracted radially.  The transverse carpal ligament was then incised proximally.  Scissors were used to split the distal aspect of the volar antebrachial fascia.  A finger was placed into the wound to ensure complete decompression, which was the case.  The nerve was examined.  It was flattened and hyperemic.  The motor branch was identified and was intact.  The wound was copiously irrigated with sterile saline.  It was then closed with 4-0 nylon in a horizontal mattress fashion.  It was injected with 0.25% plain Marcaine to aid in postoperative analgesia.  It was dressed with sterile Xeroform, 4x4s, an ABD, and wrapped with Kerlix and an Ace bandage.  Tourniquet was deflated at 32 minutes.  Fingertips were pink with brisk capillary refill after deflation of the tourniquet.  Operative drapes were broken down.  The patient was awoken from anesthesia safely.  She was transferred back to stretcher and taken to  the PACU in stable condition.  I will see her back in the office in 1 week for postoperative followup.  I will give her a prescription for Norco 5/325 1-2 tabs PO q6 hours prn pain, dispense # 20.    Betha Loa, MD Electronically signed, 01/12/20

## 2020-01-12 NOTE — H&P (Signed)
[place] Diane Sweeney is an 39 y.o. female.   Chief Complaint: carpal tunnel syndrome HPI: 39 yo female with numbness and tingling in left hand.  Nocturnal symptoms.  Positive nerve conduction studies.  She wishes to have left carpal tunnel release.  Allergies: No Known Allergies  Past Medical History:  Diagnosis Date  . Bilateral carpal tunnel syndrome   . Pseudotumor cerebri     Past Surgical History:  Procedure Laterality Date  . ABDOMINAL HYSTERECTOMY    . ADENOIDECTOMY    . CARPAL TUNNEL RELEASE Right 07/14/2019   Procedure: RIGHT CARPAL TUNNEL RELEASE;  Surgeon: Betha Loa, MD;  Location: [facility];  Service: Orthopedics;  Laterality: Right;    Family History: History reviewed. No pertinent family history.  Social History:   reports that she has never smoked. She has never used smokeless tobacco. She reports current alcohol use. She reports that she does not use drugs.  Medications: Medications Prior to Admission  Medication Sig Dispense Refill  . Ibuprofen 200 MG CAPS Take 200 mg by mouth every 6 (six) hours as needed.    Marland Kitchen HYDROcodone-acetaminophen (NORCO) 5-325 MG tablet 1-2 tabs po q6 hours prn pain 20 tablet 0    No results found for this or any previous visit (from the past 48 hour(s)).  No results found.   A comprehensive review of systems was negative.  Height 5\' 2"  (1.575 m), weight 112 kg.  General appearance: alert, cooperative and appears stated age Head: Normocephalic, without obvious abnormality, atraumatic Neck: supple, symmetrical, trachea midline Cardio: regular rate and rhythm Resp: clear to auscultation bilaterally Extremities: Intact sensation and capillary refill all digits.  +epl/fpl/io.  No wounds.  Pulses: 2+ and symmetric Skin: Skin color, texture, turgor normal. No rashes or lesions Neurologic: Grossly normal Incision/Wound: none  Assessment/Plan Left carpal tunnel syndrome.  Non operative and operative treatment  options have been discussed with the patient and patient wishes to proceed with operative treatment. Risks, benefits, and alternatives of surgery have been discussed and the patient agrees with the plan of care.   01/12/2020, 11:42 AM

## 2020-01-12 NOTE — Anesthesia Procedure Notes (Signed)
Date/Time: 01/12/2020 2:06 PM Performed by: Thornell Mule, CRNA Oxygen Delivery Method: Simple face mask

## 2020-01-12 NOTE — Transfer of Care (Signed)
Immediate Anesthesia Transfer of Care Note  Patient: Diane Sweeney  Procedure(s) Performed: CARPAL TUNNEL RELEASE (Left Wrist)  Patient Location: PACU  Anesthesia Type:MAC and Bier block  Level of Consciousness: drowsy, patient cooperative and responds to stimulation  Airway & Oxygen Therapy: Patient Spontanous Breathing and Patient connected to face mask oxygen  Post-op Assessment: Report given to RN and Post -op Vital signs reviewed and stable  Post vital signs: Reviewed and stable  Last Vitals:  Vitals Value Taken Time  BP    Temp    Pulse 72 01/12/20 1451  Resp 19 01/12/20 1451  SpO2 100 % 01/12/20 1451  Vitals shown include unvalidated device data.  Last Pain:  Vitals:   01/12/20 1221  TempSrc: Oral  PainSc: 0-No pain         Complications: No complications documented.

## 2020-01-12 NOTE — Anesthesia Postprocedure Evaluation (Signed)
Anesthesia Post Note  Patient: Diane Sweeney  Procedure(s) Performed: CARPAL TUNNEL RELEASE (Left Wrist)     Patient location during evaluation: PACU Anesthesia Type: MAC Level of consciousness: awake and alert Pain management: pain level controlled Vital Signs Assessment: post-procedure vital signs reviewed and stable Respiratory status: spontaneous breathing, nonlabored ventilation and respiratory function stable Cardiovascular status: stable and blood pressure returned to baseline Postop Assessment: no apparent nausea or vomiting Anesthetic complications: no   No complications documented.  Last Vitals:  Vitals:   01/12/20 1450 01/12/20 1500  BP: 122/71 123/77  Pulse: 78 (!) 54  Resp: 19 16  Temp: 36.4 C   SpO2: 99% 99%    Last Pain:  Vitals:   01/12/20 1500  TempSrc:   PainSc: 0-No pain                 Burnell Hurta,W. EDMOND

## 2020-01-12 NOTE — Discharge Instructions (Addendum)
[facility] Instructions Hand Surgery  Wound Care: Keep your hand elevated above the level of your heart.  Do not allow it to dangle by your side.  Keep the dressing dry and do not remove it unless your doctor advises you to do so.  He will usually change it at the time of your post-op visit.  Moving your fingers is advised to stimulate circulation but will depend on the site of your surgery.  If you have a splint applied, your doctor will advise you regarding movement.  Activity: Do not drive or operate machinery today.  Rest today and then you may return to your normal activity and work as indicated by your physician.  Diet:  Drink liquids today or eat a light diet.  You may resume a regular diet tomorrow.    General expectations: Pain for two to three days. Fingers may become slightly swollen.  Call your doctor if any of the following occur: Severe pain not relieved by pain medication. Elevated temperature. Dressing soaked with blood. Inability to move fingers. White or bluish color to fingers.  No Tylenol until 7:00pm if needed   Post Anesthesia Home Care Instructions  Activity: Get plenty of rest for the remainder of the day. A responsible individual must stay with you for 24 hours following the procedure.  For the next 24 hours, DO NOT: -Drive a car -Advertising copywriter -Drink alcoholic beverages -Take any medication unless instructed by your physician -Make any legal decisions or sign important papers.  Meals: Start with liquid foods such as gelatin or soup. Progress to regular foods as tolerated. Avoid greasy, spicy, heavy foods. If nausea and/or vomiting occur, drink only clear liquids until the nausea and/or vomiting subsides. Call your physician if vomiting continues.  Special Instructions/Symptoms: Your throat may feel dry or sore from the anesthesia or the breathing tube placed in your throat during surgery. If this causes discomfort, gargle with warm salt water.  The discomfort should disappear within 24 hours.  If you had a scopolamine patch placed behind your ear for the management of post- operative nausea and/or vomiting:  1. The medication in the patch is effective for 72 hours, after which it should be removed.  Wrap patch in a tissue and discard in the trash. Wash hands thoroughly with soap and water. 2. You may remove the patch earlier than 72 hours if you experience unpleasant side effects which may include dry mouth, dizziness or visual disturbances. 3. Avoid touching the patch. Wash your hands with soap and water after contact with the patch.

## 2020-01-16 ENCOUNTER — Encounter (HOSPITAL_BASED_OUTPATIENT_CLINIC_OR_DEPARTMENT_OTHER): Payer: Self-pay | Admitting: Orthopedic Surgery

## 2020-01-26 DIAGNOSIS — G5603 Carpal tunnel syndrome, bilateral upper limbs: Secondary | ICD-10-CM | POA: Diagnosis not present

## 2020-02-13 DIAGNOSIS — M545 Low back pain: Secondary | ICD-10-CM | POA: Diagnosis not present

## 2020-02-29 DIAGNOSIS — M545 Low back pain, unspecified: Secondary | ICD-10-CM | POA: Diagnosis not present

## 2020-03-01 DIAGNOSIS — M545 Low back pain, unspecified: Secondary | ICD-10-CM | POA: Diagnosis not present

## 2020-03-07 DIAGNOSIS — M4316 Spondylolisthesis, lumbar region: Secondary | ICD-10-CM | POA: Diagnosis not present

## 2020-03-07 DIAGNOSIS — M5136 Other intervertebral disc degeneration, lumbar region: Secondary | ICD-10-CM | POA: Diagnosis not present

## 2020-03-07 DIAGNOSIS — M545 Low back pain, unspecified: Secondary | ICD-10-CM | POA: Diagnosis not present

## 2020-03-12 DIAGNOSIS — M545 Low back pain, unspecified: Secondary | ICD-10-CM | POA: Diagnosis not present

## 2020-03-20 DIAGNOSIS — M545 Low back pain, unspecified: Secondary | ICD-10-CM | POA: Diagnosis not present

## 2020-03-27 DIAGNOSIS — M545 Low back pain, unspecified: Secondary | ICD-10-CM | POA: Diagnosis not present

## 2020-03-30 DIAGNOSIS — M5126 Other intervertebral disc displacement, lumbar region: Secondary | ICD-10-CM | POA: Diagnosis not present

## 2020-03-30 DIAGNOSIS — M5136 Other intervertebral disc degeneration, lumbar region: Secondary | ICD-10-CM | POA: Diagnosis not present

## 2020-04-08 DIAGNOSIS — M545 Low back pain, unspecified: Secondary | ICD-10-CM | POA: Diagnosis not present

## 2020-04-15 DIAGNOSIS — M545 Low back pain, unspecified: Secondary | ICD-10-CM | POA: Diagnosis not present

## 2020-04-16 DIAGNOSIS — M5136 Other intervertebral disc degeneration, lumbar region: Secondary | ICD-10-CM | POA: Diagnosis not present

## 2020-04-16 DIAGNOSIS — M5126 Other intervertebral disc displacement, lumbar region: Secondary | ICD-10-CM | POA: Diagnosis not present

## 2020-04-16 DIAGNOSIS — M4316 Spondylolisthesis, lumbar region: Secondary | ICD-10-CM | POA: Diagnosis not present

## 2020-04-16 DIAGNOSIS — Z6841 Body Mass Index (BMI) 40.0 and over, adult: Secondary | ICD-10-CM | POA: Diagnosis not present

## 2020-04-24 DIAGNOSIS — N83202 Unspecified ovarian cyst, left side: Secondary | ICD-10-CM | POA: Diagnosis not present

## 2020-04-25 DIAGNOSIS — M545 Low back pain, unspecified: Secondary | ICD-10-CM | POA: Diagnosis not present

## 2020-05-21 DIAGNOSIS — U071 COVID-19: Secondary | ICD-10-CM | POA: Diagnosis not present

## 2020-06-26 DIAGNOSIS — N83202 Unspecified ovarian cyst, left side: Secondary | ICD-10-CM | POA: Diagnosis not present

## 2020-08-14 DIAGNOSIS — N393 Stress incontinence (female) (male): Secondary | ICD-10-CM | POA: Diagnosis not present

## 2020-08-14 DIAGNOSIS — Z6841 Body Mass Index (BMI) 40.0 and over, adult: Secondary | ICD-10-CM | POA: Diagnosis not present

## 2020-11-14 DIAGNOSIS — M67871 Other specified disorders of synovium, right ankle and foot: Secondary | ICD-10-CM | POA: Diagnosis not present

## 2020-11-28 DIAGNOSIS — M79671 Pain in right foot: Secondary | ICD-10-CM | POA: Diagnosis not present

## 2020-12-03 DIAGNOSIS — M79671 Pain in right foot: Secondary | ICD-10-CM | POA: Diagnosis not present

## 2020-12-05 DIAGNOSIS — M79671 Pain in right foot: Secondary | ICD-10-CM | POA: Diagnosis not present

## 2020-12-06 DIAGNOSIS — Z13220 Encounter for screening for lipoid disorders: Secondary | ICD-10-CM | POA: Diagnosis not present

## 2020-12-06 DIAGNOSIS — R7303 Prediabetes: Secondary | ICD-10-CM | POA: Diagnosis not present

## 2020-12-06 DIAGNOSIS — Z5181 Encounter for therapeutic drug level monitoring: Secondary | ICD-10-CM | POA: Diagnosis not present

## 2020-12-06 DIAGNOSIS — Z0000 Encounter for general adult medical examination without abnormal findings: Secondary | ICD-10-CM | POA: Diagnosis not present

## 2020-12-11 DIAGNOSIS — M79671 Pain in right foot: Secondary | ICD-10-CM | POA: Diagnosis not present

## 2020-12-13 DIAGNOSIS — M79671 Pain in right foot: Secondary | ICD-10-CM | POA: Diagnosis not present

## 2020-12-18 DIAGNOSIS — M79671 Pain in right foot: Secondary | ICD-10-CM | POA: Diagnosis not present

## 2020-12-27 DIAGNOSIS — M67871 Other specified disorders of synovium, right ankle and foot: Secondary | ICD-10-CM | POA: Diagnosis not present

## 2021-01-14 ENCOUNTER — Other Ambulatory Visit: Payer: Self-pay | Admitting: Family Medicine

## 2021-01-14 DIAGNOSIS — Z1231 Encounter for screening mammogram for malignant neoplasm of breast: Secondary | ICD-10-CM

## 2021-02-05 ENCOUNTER — Ambulatory Visit
Admission: RE | Admit: 2021-02-05 | Discharge: 2021-02-05 | Disposition: A | Discharge status: Home / Self Care | Payer: BC Managed Care – PPO | Source: Ambulatory Visit | Attending: Family Medicine | Admitting: Family Medicine

## 2021-02-05 ENCOUNTER — Other Ambulatory Visit: Payer: Self-pay

## 2021-02-05 DIAGNOSIS — Z1231 Encounter for screening mammogram for malignant neoplasm of breast: Secondary | ICD-10-CM | POA: Diagnosis not present

## 2022-03-26 IMAGING — MG MM DIGITAL SCREENING BILAT W/ TOMO AND CAD
8 series · 8 of 24 positions shown · non-contrast
Comparison: None.

CLINICAL DATA: Screening.

EXAM:
DIGITAL SCREENING BILATERAL MAMMOGRAM WITH TOMOSYNTHESIS AND CAD
TECHNIQUE: Bilateral screening digital craniocaudal and mediolateral oblique
mammograms were obtained. Bilateral screening digital breast
tomosynthesis was performed. The images were evaluated with
computer-aided detection.

[R CC synth-2D]
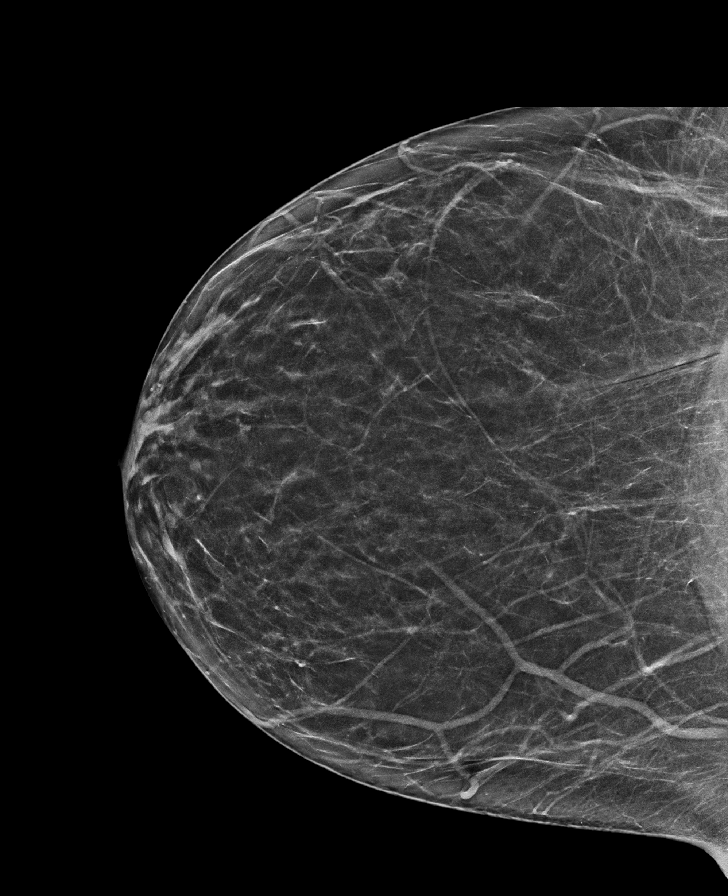

[L CC synth-2D]
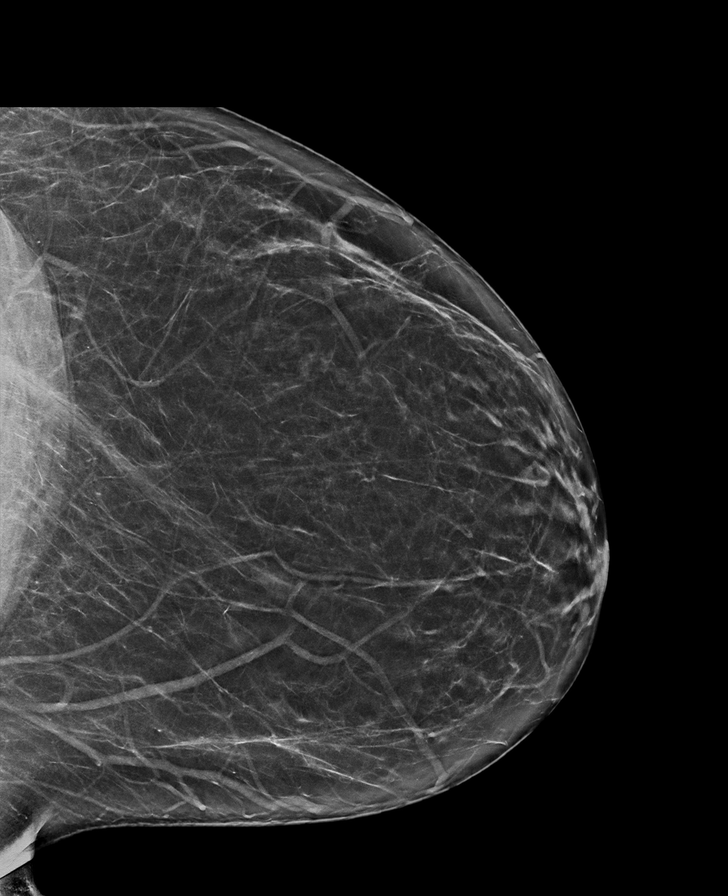

[R MLO synth-2D]
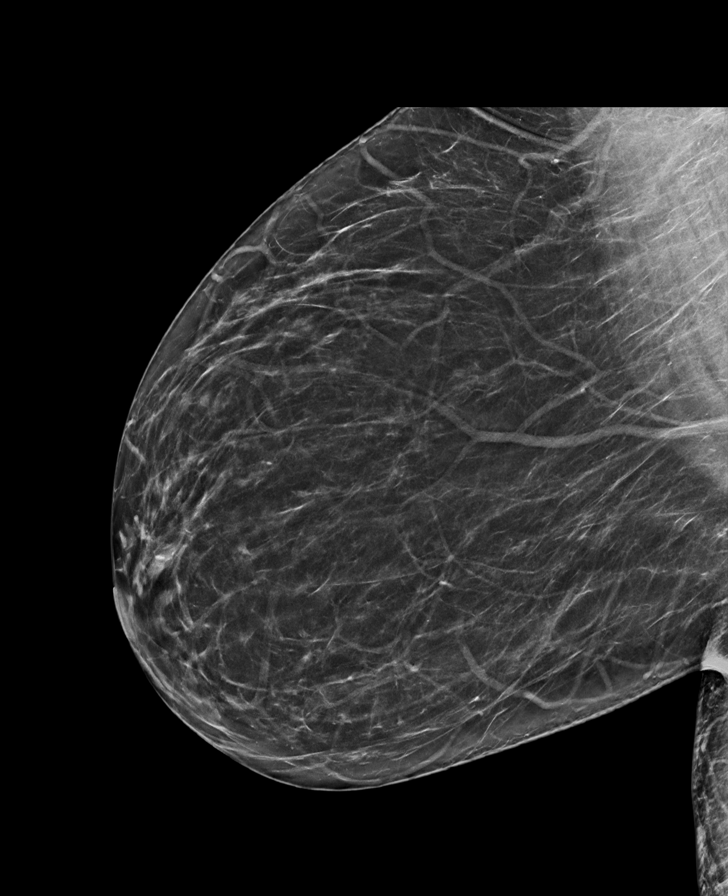

[L MLO synth-2D]
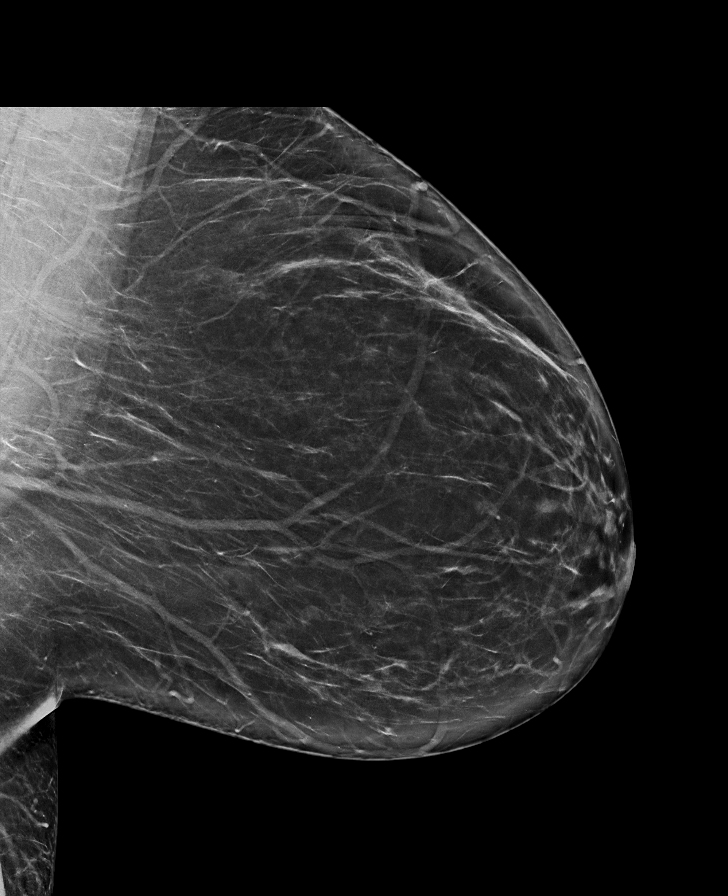

[L CC tomo · tomo slice 36/71.0]
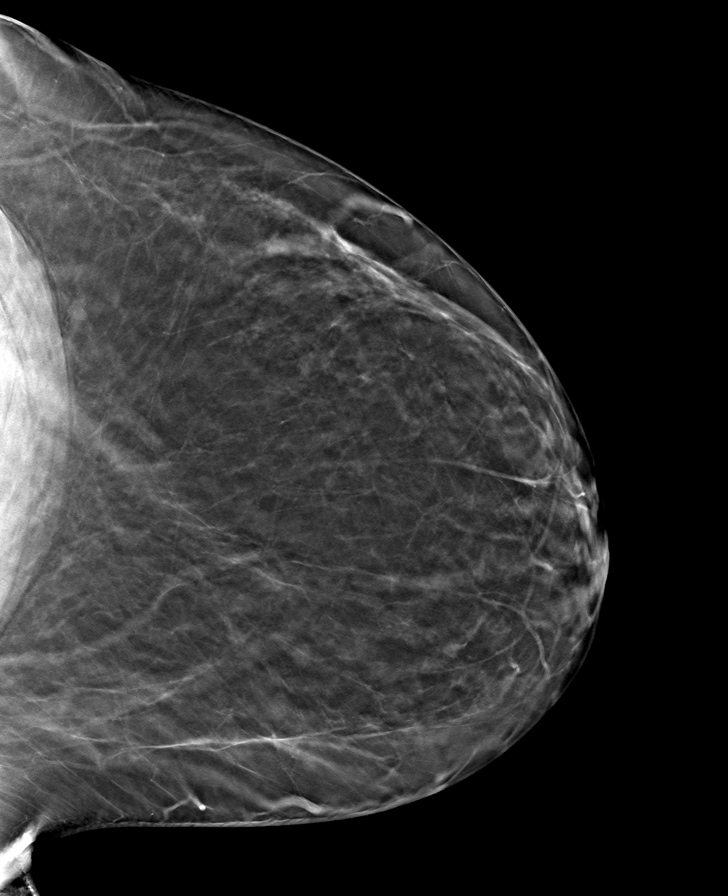

[R CC tomo · tomo slice 33/65.0]
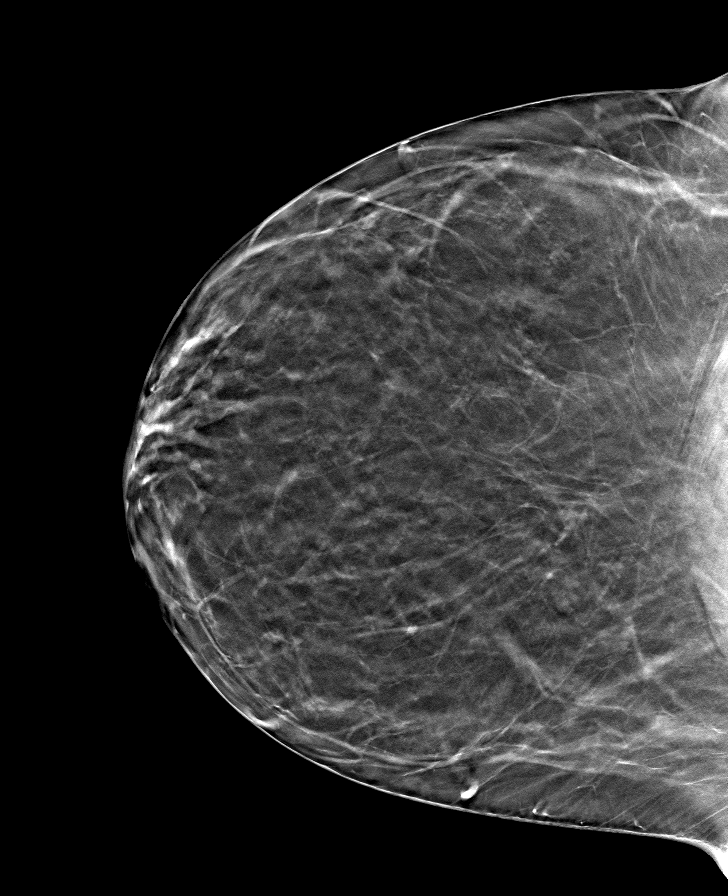

[R MLO tomo · tomo slice 37/72.0]
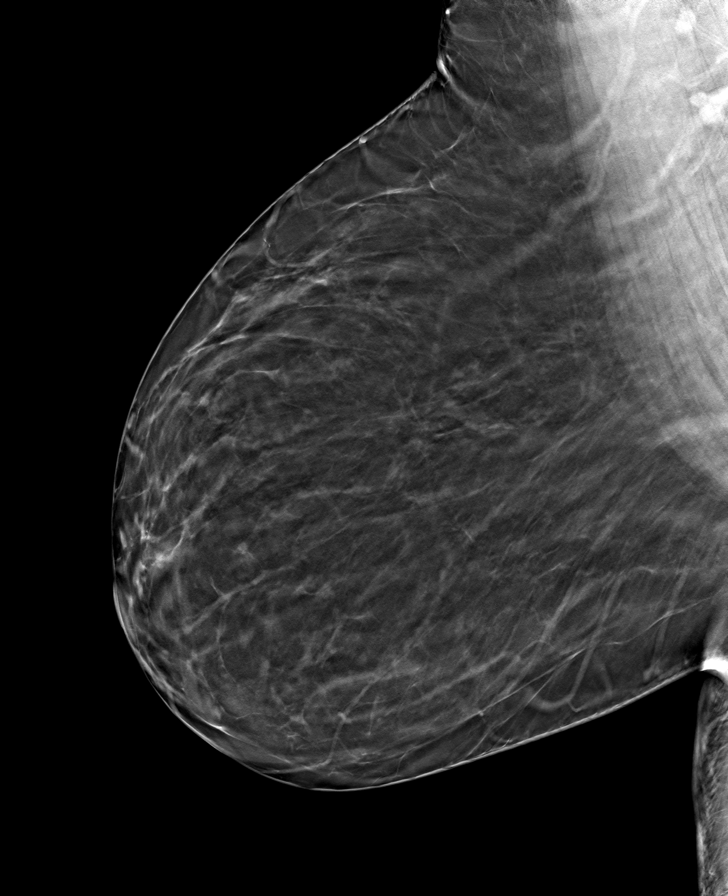

[L MLO tomo · tomo slice 42/83.0]
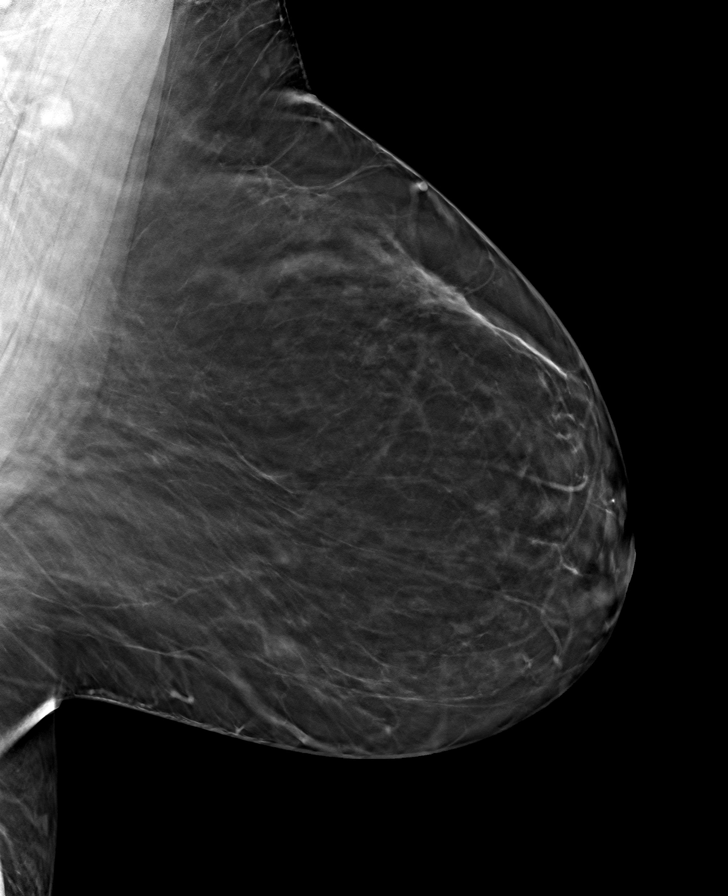

[8 of 24 positions shown; findings below may reference images not displayed]

ACR Breast Density Category b: There are scattered areas of
fibroglandular density.
FINDINGS: There are no findings suspicious for malignancy.
IMPRESSION: No mammographic evidence of malignancy. A result letter of this
screening mammogram will be mailed directly to the patient.

RECOMMENDATION:
Screening mammogram in one year. (Code:XG-X-[id number])

BI-RADS CATEGORY  1: Negative.

## 2022-04-07 ENCOUNTER — Other Ambulatory Visit: Payer: Self-pay | Admitting: Family Medicine

## 2022-04-07 DIAGNOSIS — Z1231 Encounter for screening mammogram for malignant neoplasm of breast: Secondary | ICD-10-CM

## 2022-04-21 ENCOUNTER — Ambulatory Visit
Admission: RE | Admit: 2022-04-21 | Discharge: 2022-04-21 | Disposition: A | Discharge status: Home / Self Care | Payer: Managed Care, Other (non HMO) | Source: Ambulatory Visit | Attending: Family Medicine | Admitting: Family Medicine

## 2022-04-21 DIAGNOSIS — Z1231 Encounter for screening mammogram for malignant neoplasm of breast: Secondary | ICD-10-CM

## 2023-03-15 ENCOUNTER — Other Ambulatory Visit: Payer: Self-pay | Admitting: Family Medicine

## 2023-03-15 DIAGNOSIS — Z1231 Encounter for screening mammogram for malignant neoplasm of breast: Secondary | ICD-10-CM

## 2023-04-27 ENCOUNTER — Ambulatory Visit
Admission: RE | Admit: 2023-04-27 | Discharge: 2023-04-27 | Disposition: A | Discharge status: Home / Self Care | Payer: Managed Care, Other (non HMO) | Source: Ambulatory Visit | Attending: Family Medicine | Admitting: Family Medicine

## 2023-04-27 DIAGNOSIS — Z1231 Encounter for screening mammogram for malignant neoplasm of breast: Secondary | ICD-10-CM

## 2024-04-25 ENCOUNTER — Other Ambulatory Visit: Payer: Self-pay | Admitting: Family Medicine

## 2024-04-25 DIAGNOSIS — Z1231 Encounter for screening mammogram for malignant neoplasm of breast: Secondary | ICD-10-CM

## 2024-05-09 ENCOUNTER — Ambulatory Visit
Admission: RE | Admit: 2024-05-09 | Discharge: 2024-05-09 | Disposition: A | Source: Ambulatory Visit | Attending: Family Medicine | Admitting: Family Medicine

## 2024-05-09 DIAGNOSIS — Z1231 Encounter for screening mammogram for malignant neoplasm of breast: Secondary | ICD-10-CM
